# Patient Record
Sex: Female | Born: 1940
Health system: Southern US, Community
[De-identification: ages and names within clinical notes are randomized; demographics above are authoritative.]

## PROBLEM LIST (undated history)

## (undated) DIAGNOSIS — C441192 Basal cell carcinoma of skin of left lower eyelid, including canthus: Secondary | ICD-10-CM

## (undated) DIAGNOSIS — G4733 Obstructive sleep apnea (adult) (pediatric): Secondary | ICD-10-CM

## (undated) DIAGNOSIS — E7800 Pure hypercholesterolemia, unspecified: Secondary | ICD-10-CM

## (undated) DIAGNOSIS — E119 Type 2 diabetes mellitus without complications: Secondary | ICD-10-CM

## (undated) DIAGNOSIS — H409 Unspecified glaucoma: Secondary | ICD-10-CM

## (undated) DIAGNOSIS — E785 Hyperlipidemia, unspecified: Secondary | ICD-10-CM

## (undated) DIAGNOSIS — K219 Gastro-esophageal reflux disease without esophagitis: Secondary | ICD-10-CM

## (undated) DIAGNOSIS — F329 Major depressive disorder, single episode, unspecified: Secondary | ICD-10-CM

## (undated) DIAGNOSIS — M722 Plantar fascial fibromatosis: Secondary | ICD-10-CM

## (undated) DIAGNOSIS — I10 Essential (primary) hypertension: Secondary | ICD-10-CM

## (undated) DIAGNOSIS — Z9989 Dependence on other enabling machines and devices: Secondary | ICD-10-CM

## (undated) DIAGNOSIS — J302 Other seasonal allergic rhinitis: Secondary | ICD-10-CM

## (undated) HISTORY — DX: Major depressive disorder, single episode, unspecified: F32.9

## (undated) HISTORY — PX: WISDOM TOOTH EXTRACTION: SHX21

## (undated) HISTORY — DX: Hyperlipidemia, unspecified: E78.5

## (undated) HISTORY — DX: Type 2 diabetes mellitus without complications: E11.9

## (undated) HISTORY — PX: COLONOSCOPY: SHX174

## (undated) HISTORY — DX: Obstructive sleep apnea (adult) (pediatric): G47.33

## (undated) HISTORY — PX: TONSILLECTOMY: SUR1361

## (undated) HISTORY — DX: Gastro-esophageal reflux disease without esophagitis: K21.9

## (undated) HISTORY — DX: Basal cell carcinoma of skin of left lower eyelid, including canthus: C44.1192

## (undated) HISTORY — DX: Dependence on other enabling machines and devices: Z99.89

## (undated) HISTORY — PX: TUBAL LIGATION: SHX77

---

## 2013-01-27 ENCOUNTER — Emergency Department (HOSPITAL_BASED_OUTPATIENT_CLINIC_OR_DEPARTMENT_OTHER): Payer: No Typology Code available for payment source

## 2013-01-27 ENCOUNTER — Encounter (HOSPITAL_BASED_OUTPATIENT_CLINIC_OR_DEPARTMENT_OTHER): Payer: Self-pay | Admitting: Emergency Medicine

## 2013-01-27 ENCOUNTER — Emergency Department (HOSPITAL_BASED_OUTPATIENT_CLINIC_OR_DEPARTMENT_OTHER)
Admission: EM | Admit: 2013-01-27 | Discharge: 2013-01-27 | Disposition: A | Discharge status: Home / Self Care | Payer: No Typology Code available for payment source | Attending: Emergency Medicine | Admitting: Emergency Medicine

## 2013-01-27 DIAGNOSIS — Z8739 Personal history of other diseases of the musculoskeletal system and connective tissue: Secondary | ICD-10-CM | POA: Insufficient documentation

## 2013-01-27 DIAGNOSIS — Y92410 Unspecified street and highway as the place of occurrence of the external cause: Secondary | ICD-10-CM | POA: Insufficient documentation

## 2013-01-27 DIAGNOSIS — S199XXA Unspecified injury of neck, initial encounter: Secondary | ICD-10-CM | POA: Insufficient documentation

## 2013-01-27 DIAGNOSIS — Z79899 Other long term (current) drug therapy: Secondary | ICD-10-CM | POA: Insufficient documentation

## 2013-01-27 DIAGNOSIS — Z7982 Long term (current) use of aspirin: Secondary | ICD-10-CM | POA: Insufficient documentation

## 2013-01-27 DIAGNOSIS — I10 Essential (primary) hypertension: Secondary | ICD-10-CM | POA: Insufficient documentation

## 2013-01-27 DIAGNOSIS — S0993XA Unspecified injury of face, initial encounter: Secondary | ICD-10-CM | POA: Insufficient documentation

## 2013-01-27 DIAGNOSIS — E7800 Pure hypercholesterolemia, unspecified: Secondary | ICD-10-CM | POA: Insufficient documentation

## 2013-01-27 DIAGNOSIS — S0990XA Unspecified injury of head, initial encounter: Secondary | ICD-10-CM | POA: Insufficient documentation

## 2013-01-27 DIAGNOSIS — Y9389 Activity, other specified: Secondary | ICD-10-CM | POA: Insufficient documentation

## 2013-01-27 DIAGNOSIS — T148XXA Other injury of unspecified body region, initial encounter: Secondary | ICD-10-CM

## 2013-01-27 DIAGNOSIS — Z8669 Personal history of other diseases of the nervous system and sense organs: Secondary | ICD-10-CM | POA: Insufficient documentation

## 2013-01-27 HISTORY — DX: Pure hypercholesterolemia, unspecified: E78.00

## 2013-01-27 HISTORY — DX: Essential (primary) hypertension: I10

## 2013-01-27 HISTORY — DX: Plantar fascial fibromatosis: M72.2

## 2013-01-27 HISTORY — DX: Unspecified glaucoma: H40.9

## 2013-01-27 HISTORY — DX: Other seasonal allergic rhinitis: J30.2

## 2013-01-27 MED ORDER — CYCLOBENZAPRINE HCL 10 MG PO TABS: 10.0000 mg | ORAL_TABLET | Freq: Two times a day (BID) | ORAL | Status: DC | PRN

## 2013-01-27 MED ORDER — NAPROXEN 250 MG PO TABS
250.0000 mg | ORAL_TABLET | Freq: Once | ORAL | Status: AC
  Administered 2013-01-27: 250 mg via ORAL
  Filled 2013-01-27: qty 1

## 2013-01-27 MED ORDER — ACETAMINOPHEN 325 MG PO TABS: 650.0000 mg | ORAL_TABLET | Freq: Four times a day (QID) | ORAL | Status: AC | PRN

## 2013-01-27 NOTE — ED Provider Notes (Signed)
Medical screening examination/treatment/procedure(s) were performed by non-physician practitioner and as supervising physician I was immediately available for consultation/collaboration.  Ethelda Chick, MD 01/27/13 [phone]
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
## 2017-06-08 ENCOUNTER — Encounter: Payer: Self-pay | Admitting: Neurology

## 2017-06-09 ENCOUNTER — Ambulatory Visit (INDEPENDENT_AMBULATORY_CARE_PROVIDER_SITE_OTHER): Payer: Medicare Other | Admitting: Neurology

## 2017-06-09 ENCOUNTER — Encounter: Payer: Self-pay | Admitting: Neurology

## 2017-06-09 VITALS — BP 128/78 | HR 60 | Ht 61.0 in | Wt 125.0 lb

## 2017-06-09 DIAGNOSIS — F5104 Psychophysiologic insomnia: Secondary | ICD-10-CM

## 2017-06-09 DIAGNOSIS — G4733 Obstructive sleep apnea (adult) (pediatric): Secondary | ICD-10-CM

## 2017-06-09 DIAGNOSIS — E1140 Type 2 diabetes mellitus with diabetic neuropathy, unspecified: Secondary | ICD-10-CM

## 2017-06-09 DIAGNOSIS — G4719 Other hypersomnia: Secondary | ICD-10-CM

## 2017-06-09 DIAGNOSIS — E1149 Type 2 diabetes mellitus with other diabetic neurological complication: Secondary | ICD-10-CM | POA: Diagnosis not present

## 2017-06-09 MED ORDER — ALPRAZOLAM 0.5 MG PO TABS: 0.5000 mg | ORAL_TABLET | Freq: Every evening | ORAL | 0 refills | Status: DC | PRN

## 2017-06-09 NOTE — Progress Notes (Signed)
SLEEP MEDICINE CLINIC   Provider:  Larey Seat, [place] D  Primary Care Physician:  Loraine Leriche., MD   Referring Provider: Lilian Coma., MD    Chief Complaint  Patient presents with  . New Patient (Initial Visit)    pt presents here today with her husband. pt had a sleey study in June 2018 and they are sending her here to evaluate the need for CPAP.    HPI:  Brenda Hood is a 76 y.o. female , seen here as in a referral/ revisit  from Dr. Valora Piccolo for a follow up on an outside sleep study(  [facility] , Dr. Tonia Ghent  )   Chief complaint according to patient : Brenda Hood presents today on 06/09/2017 in follow-up from Dr. Derenda Mis, who ordered a sleep study for the patient which was performed at the [facility] neurologic group.  Sleep study was performed on 04/04/2017, the patient had endorsed an Epworth sleepiness score of 20 points, and reported inomnia and fragmented sleep.   She was awake for 204 minutes during the night, there was no REM sleep noticed - sleep efficiency was only 54%, her AHI was 8 / hour which is very low, she would not be considered positive for sleep apnea and would not require CPAP.  Sleep habits are as follows: The patient shares a bedroom with her husband, was a CPAP user. She often dozes off while watching TV or whenever physically not busy during the day. At night however it's difficult for her to go to sleep and she has started to take melatonin 1 mg tablets to 10 are allowed per day she was told. She does still take an hour to go to sleep on average, once asleep she has trouble staying asleep, she is not always sure what wakes her, but once awake she will go to the bathroom. She will have multiple of these wakefulness.  She denies dreams, hallucinations or sleep paralysis, no cataplexy has been reported. Throughout the night, and rises in the morning usually at 8 AM.  Varies greatly- she sleeps on her side , on  1 pillow. She raises the head of her bed ( 20 degrees) The bedroom is cool and quiet and dark. She sometimes turns the Tv on- and will go to sleep.   Sleep medical history and family sleep history:  No insomnia in family. Diabetes in all siblings, both parents.   Social history:  Married, never used tobacco , no ETOH consue, Caffeine ; coffee 2 -3 in AM. Soda and iced tea - now 1 a day.    Review of Systems: Out of a complete 14 system review, the patient complains of only the following symptoms, and all other reviewed systems are negative.  The patient complains of excessive daytime sleepiness reflected and an Epworth score of 20 out of 24 points, tendency to bruise easier, she has a history of anemia, coughing and wheezing, diarrhea, restless legs, insomnia she has been diagnosed with diabetes possible neuropathy, high cholesterol, and had the skin surgery in 2017 near the left lower eye lid for for basal cell carcinoma, Mohs procedure .   Epworth score 20/24  , Fatigue severity score 54   , depression score 4/15    Social History   Social History  . Marital status: Married    Spouse name: N/A  . Number of children: N/A  . Years of education: N/A   Occupational History  . Not on file.  Social History Main Topics  . Smoking status: Never Smoker  . Smokeless tobacco: Never Used  . Alcohol use No  . Drug use: No  . Sexual activity: Not on file   Other Topics Concern  . Not on file   Social History Narrative  . No narrative on file    Family History  Problem Relation Age of Onset  . Cancer Mother   . Diabetes Mother   . Diabetes Father     Past Medical History:  Diagnosis Date  . Basal cell carcinoma of left lower eyelid   . Diabetes mellitus without complication ([place])   . GERD (gastroesophageal reflux disease)   . Glaucoma   . Hypercholesteremia   . Hyperlipidemia   . Hypertension   . Major depression   . Plantar fasciitis   . Seasonal allergies     Past  Surgical History:  Procedure Laterality Date  . COLONOSCOPY    . TONSILLECTOMY    . TUBAL LIGATION    . WISDOM TOOTH EXTRACTION      Current Outpatient Prescriptions  Medication Sig Dispense Refill  . acetaminophen (TYLENOL) 325 MG tablet Take 2 tablets (650 mg total) by mouth every 6 (six) hours as needed for pain. 20 tablet 0  . alendronate (FOSAMAX) 70 MG tablet Take 70 mg by mouth once a week. Take with a full glass of water on an empty stomach.    Marland Kitchen amLODipine (NORVASC) 5 MG tablet Take 5 mg by mouth daily.    Marland Kitchen aspirin 81 MG tablet Take 81 mg by mouth daily.    Marland Kitchen atorvastatin (LIPITOR) 80 MG tablet Take 40 mg by mouth daily.    . Calcium Carbonate-Vitamin D (CALCIUM 600/VITAMIN D PO) Take 2 tablets by mouth daily.    . cetirizine (ZYRTEC) 10 MG tablet Take 10 mg by mouth daily.    . cyclobenzaprine (FLEXERIL) 10 MG tablet Take 1 tablet (10 mg total) by mouth 2 (two) times daily as needed for muscle spasms. 20 tablet 0  . fluticasone (FLONASE) 50 MCG/ACT nasal spray Place 2 sprays into both nostrils daily.    . mometasone (NASONEX) 50 MCG/ACT nasal spray Place 2 sprays into the nose daily.    . montelukast (SINGULAIR) 10 MG tablet Take 10 mg by mouth at bedtime.    . pantoprazole (PROTONIX) 40 MG tablet Take 40 mg by mouth daily.    . Travoprost, BAK Free, (TRAVATAN) 0.004 % SOLN ophthalmic solution 1 drop at bedtime.    Marland Kitchen UNKNOWN TO PATIENT HTN med     No current facility-administered medications for this visit.     Allergies as of 06/09/2017 - Review Complete 01/27/2013  Allergen Reaction Noted  . Codeine Itching 01/27/2013  . Metformin and related Diarrhea 01/27/2013    Vitals: BP 128/78   Pulse 60   Ht 5\' 1"  (1.549 m)   Wt 125 lb (56.7 kg)   BMI 23.62 kg/m  Last Weight:  Wt Readings from Last 1 Encounters:  06/09/17 125 lb (56.7 kg)   RXV:QMGQ mass index is 23.62 kg/m.     Last Height:   Ht Readings from Last 1 Encounters:  06/09/17 5\' 1"  (1.549 m)     Physical exam:  General: The patient is awake, alert and appears not in acute distress. The patient is well groomed. Head: Normocephalic, atraumatic. Neck is supple. Mallampati 2,  neck circumference:14.25. Nasal airflow congestion- rhinitis , TMJ is not evident . Retrognathia is seen.  Cardiovascular:  Regular  rate and rhythm , without  murmurs or carotid bruit, and without distended neck veins. Respiratory: Lungs are clear to auscultation. Skin:  Without evidence of edema, or rash Trunk: BMI is 24 Neurologic exam : The patient is awake and alert, oriented to place and time.  Logorrhoeic.   Attention span limited  Speech is fluent with dysphonia .  Cranial nerves: Pupils are equal and briskly reactive to light.  Extraocular movements  in vertical and horizontal planes intact and without nystagmus. Visual fields by finger perimetry are intact. Hearing to finger rub intact.  Facial sensation intact to fine touch. Facial motor strength is symmetric and tongue and uvula move midline. Shoulder shrug was symmetrical.   Motor exam: Normal tone, muscle bulk and symmetric strength in all extremities. Sensory:  Fine touch, pinprick and vibration were tested, reduced fine touch and vibration in both feet - often pin and needle Coordination:  Finger-to-nose maneuver  normal without evidence of ataxia, dysmetria or tremor. Gait and station: Patient walks without assistive device .    Assessment:  After physical and neurologic examination, review of laboratory studies,  Personal review of imaging studies, reports of other /same  Imaging studies, results of polysomnography and / or neurophysiology testing and pre-existing records as far as provided in visit., my assessment is   1)  Fragmented sleep _ Poor sleep habits and sleep hygiene. I explained some in detail, the TV should be not in the bedroom, and she needs to avoid daytime naps and caffeine.   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  I asjked her to take CYCLOBENZAPRINE in PM and before bedtime, not in daytime,  I would not be opposed to give her a daytime stimulant should her sleep study not reveal apnea  3) I consider her sleep study not diagnostic, the patient reached an AHI of only 8 but her sleep was so fragmented, that I would doubt its validity. Overall sleep time was less than 4 hours at only 211 minutes. I will petition her insurance to allow me to repeat the sleep study, I will not be opposed to use a mild sleep aid, she will have to have a designated driver to pick her up after the sleep study. If the repeat sleep study confirms apnea we will treat accordingly, if she is again left without a physiologic diagnosis, I would consider evaluating her for narcolepsy. The gold standard test for the sleep disorder however would not be valid on the medications she is currently on.    The patient was advised of the nature of the diagnosed disorder , the treatment options and the  risks for general health and wellness arising from not treating the condition.   I spent more than  45 minutes of face to face time with the patient.  Greater than 50% of time was spent in counseling and coordination of care. We have discussed the diagnosis and differential and I answered the patient's questions.    Plan:  Treatment plan and additional workup :  SPLIT night sleep study on Xanax.    Larey Seat, MD 7/61/6073, 71:06 AM  Certified in Neurology by ABPN Certified in Sleep Medicine by [facility] [address], [address]  [place], [place] [address]

## 2017-06-09 NOTE — Patient Instructions (Signed)
Please remember to try to maintain good sleep hygiene, which means: Keep a regular sleep and wake schedule, try not to exercise or have a meal within 2 hours of your bedtime, try to keep your bedroom conducive for sleep, that is, cool and dark, without light distractors such as an illuminated alarm clock, and refrain from watching TV right before sleep or in the middle of the night and do not keep the TV or radio on during the night. Also, try not to use or play on electronic devices at bedtime, such as your cell phone, tablet PC or laptop. If you like to read at bedtime on an electronic device, try to dim the background light as much as possible. Do not eat in the middle of the night.   We will request a sleep study.    We will look for leg twitching and snoring or sleep apnea.   For chronic insomnia, you are best followed by a psychiatrist and/or sleep psychologist.   We will call you with the sleep study results and make a follow up appointment if needed.   Hypersomnia Hypersomnia is when you feel extremely tired during the day even though you're getting plenty of sleep at night. You may need to take naps during the day, and you may also be extremely difficult to wake up when you are sleeping. What are the causes? The cause of your hypersomnia may not be known. Hypersomnia may be caused by:  Medicines.  Sleep disorders, such as narcolepsy.  Trauma or injury to your head or nervous system.  Using drugs or alcohol.  Tumors.  Medical conditions, such as depression or hypothyroidism.  Genetics.  What are the signs or symptoms? The main symptoms of hypersomnia include:  Feeling extremely tired throughout the day.  Being very difficult to wake up.  Sleeping for longer and longer periods.  Taking naps throughout the day.  Other symptoms may include:  Feeling: ? Restless. ? Annoyed. ? Anxious. ? Low energy.  Having  difficulty: ? Remembering. ? Speaking. ? Thinking.  Losing your appetite.  Experiencing hallucinations.  How is this diagnosed? Hypersomnia may be diagnosed by:  Medical history and physical exam. This will include a sleep history.  Completing sleep logs.  Tests may also be done, such as: ? Polysomnography. ? Multiple sleep latency test (MSLT).  How is this treated? There is no cure for hypersomnia, but treatment can be very effective in helping manage the condition. Treatment may include:  Lifestyle and sleeping strategies to help cope with the condition.  Stimulant medicines.  Treating any underlying causes of hypersomnia.  Follow these instructions at home:  Take medicines only as directed by your health care provider.  Schedule short naps for when you feel sleepiest during the day. Tell your employer or teachers that you have hypersomnia. You may be able to adjust your schedule to include time for naps.  Avoid drinking alcohol or caffeinated beverages.  Do not eat a heavy meal before bedtime. Eat at about the same times every day.  Do not drive or operate heavy machinery if you are sleepy.  Do not swim or go out on the water without a life jacket.  If possible, adjust your schedule so that you do not have to work or be active at night.  Keep all follow-up visits as directed by your health care provider. This is important. Contact a health care provider if:  You have new symptoms.  Your symptoms get worse. Get help   right away if: You have serious thoughts of hurting yourself or someone else. This information is not intended to replace advice given to you by your health care provider. Make sure you discuss any questions you have with your health care provider. Document Released: 09/17/2002 Document Revised: 03/04/2016 Document Reviewed: 05/02/2014 Elsevier Interactive Patient Education  2018 [employer]  

## 2017-06-30 ENCOUNTER — Ambulatory Visit (INDEPENDENT_AMBULATORY_CARE_PROVIDER_SITE_OTHER): Payer: Medicare Other | Admitting: Neurology

## 2017-06-30 DIAGNOSIS — G4761 Periodic limb movement disorder: Secondary | ICD-10-CM

## 2017-06-30 DIAGNOSIS — F5104 Psychophysiologic insomnia: Secondary | ICD-10-CM

## 2017-06-30 DIAGNOSIS — G4719 Other hypersomnia: Secondary | ICD-10-CM

## 2017-06-30 DIAGNOSIS — E1140 Type 2 diabetes mellitus with diabetic neuropathy, unspecified: Secondary | ICD-10-CM

## 2017-06-30 DIAGNOSIS — E1149 Type 2 diabetes mellitus with other diabetic neurological complication: Secondary | ICD-10-CM

## 2017-06-30 DIAGNOSIS — Z72821 Inadequate sleep hygiene: Secondary | ICD-10-CM

## 2017-06-30 DIAGNOSIS — G4733 Obstructive sleep apnea (adult) (pediatric): Secondary | ICD-10-CM

## 2017-07-04 NOTE — Addendum Note (Signed)
Addended by: Larey Seat on: 07/04/2017 09:58 AM   Modules accepted: Orders

## 2017-07-04 NOTE — Procedures (Signed)
PATIENT'S NAME:  Brenda, Hood DOB:      November 10, 1940      MR#:    [id number]     DATE OF RECORDING: 06/30/2017 REFERRING M.D.:  Tiana Loft, MD Study Performed:   Baseline Polysomnogram HISTORY:  The patient complains of excessive daytime sleepiness reflected and an Epworth score of 20 / 24 points.  Had been tested in a PSG at Dr. Marya Amsler [facility]'s lab and is here for follow up on outside results. She slept very little in that recording from 04-04-2017, AHI was 8, no REM sleep. She has poor sleep habits and fragmented sleep at home. Insomnia. The patient endorsed the Epworth Sleepiness Scale at 20 points and FSS at 54 points, geriatric depression 4/15 points. Denies narcoleptic dream intrusion, sleep paralysis or cataplectic symptoms.  The patient's weight 125 pounds with a height of 61 (inches), resulting in a BMI of 23.7 kg/m2. The patient's neck circumference measured 14 inches.  CURRENT MEDICATIONS: Tylenol, Fosamax, Norvasc, Aspirin, Lipitor, Calcium, Zyrtec, Flexeril, Flonase, Nasonex, Singulair, Protonix, Travatan eye drops   PROCEDURE:  This is a multichannel digital polysomnogram utilizing the SomnoStar 11.2 system.  Electrodes and sensors were applied and monitored per AASM Specifications.   EEG, EOG, Chin and Limb EMG, were sampled at 200 Hz.  ECG, Snore and Nasal Pressure, Thermal Airflow, Respiratory Effort, CPAP Flow and Pressure, Oximetry was sampled at 50 Hz. Digital video and audio were recorded.      BASELINE STUDY Lights Out was at 21:03 and Lights On at 05:02.  Total recording time (TRT) was 479.5 minutes, with a total sleep time (TST) of 282 minutes. The patient's sleep latency was 213 minutes.  REM latency was 302 minutes.  The sleep efficiency was again low at 58.8 %.     SLEEP ARCHITECTURE: WASO (Wake after sleep onset) was 162 minutes.  There were 26.5 minutes in Stage N1, 217 minutes Stage N2, 25 minutes Stage N3 and 13.5 minutes in Stage REM.  The percentage of Stage N1 was  9.4%, Stage N2 was 77.%, Stage N3 was 8.9% and Stage R (REM sleep) was 4.8%.   RESPIRATORY ANALYSIS:  There were a total of 27 respiratory events:  2 obstructive apneas, 25 hypopneas with 0 respiratory event related arousals (RERAs).     The total APNEA/HYPOPNEA INDEX (AHI) was 5.7/hour and the total RESPIRATORY DISTURBANCE INDEX was 5.7 /hour.  2 events occurred in REM sleep and 46 events in NREM. The REM AHI was 8.9 /hour, versus a non-REM AHI of 5.6. The patient spent 108 minutes of total sleep time in the supine position and 174 minutes in non-supine. The supine AHI was 8.3 versus a non-supine AHI of 4.1.  OXYGEN SATURATION & C02:  The Wake baseline 02 saturation was 96%, with the lowest being 79%. Time spent below 89% saturation equaled 52 minutes.   PERIODIC LIMB MOVEMENTS:  The patient had a total of 134 Periodic Limb Movements.  The Periodic Limb Movement (PLM) index was 28.5 and the PLM Arousal index was 10.0 /hour. The arousals were noted as: 41 were spontaneous, 47 were associated with PLMs, and 0 were associated with respiratory events.  Audio and video analysis did not show any abnormal or unusual movements, behaviors, phonations or vocalizations.  Snoring was noted. The patient took one bathroom break. EKG was in keeping with normal sinus rhythm and PVCs (NSR).  Post-study, the patient indicated that sleep was the same as usual. She took a sleep aid at midnight.  IMPRESSION:  1. Mild Obstructive Sleep Apnea (OSA) but significant hypoxemia. 2. Severe Periodic Limb Movement Disorder (PLMD) 3. Primary Snoring 4. Many spontaneous arousals and delayed sleep onset due to (reported) pain and discomfort, cause of insomnia.   RECOMMENDATIONS:  1. Advise full-night, attended, CPAP titration study to optimize therapy.   2. Avoid caffeine-containing beverages and chocolate. 3. Consider cardiopulmonary evaluation for the hypoxia if not previously performed.   4. Correlate clinically for a  history consistent with regarding restless legs syndrome (RLS).    Consider secondary restless legs syndrome.  Pharmacotherapy may be warranted.  Obtain a serum ferritin level if the clinical history is consistent with RLS.  Consider iron therapy and evaluation for iron deficiency anemia if the serum ferritin level < 50 ng/mL.  Certain medications or substances may aggravate RLS and common offenders may include the following:  nicotine, caffeine, SSRIs, TCAs, phenothiazine, dopamine antagonists, diphenhydramine, and alcohol.   5. Consider dedicated sleep psychology referral if insomnia is of clinical concern ( cognitive behavior therapy)  6. A follow up appointment will be scheduled in the Sleep Clinic at [facility]. The referring provider will be notified of the results.      I certify that I have reviewed the entire raw data recording prior to the issuance of this report in accordance with the Standards of Accreditation of the [place] [employer] (AASM)      Larey Seat, MD      07-03-2017  Diplomat, American Board of Psychiatry and Neurology  Diplomat, American Board of [place] Director, [place] [facility] at [employer]

## 2017-07-05 ENCOUNTER — Telehealth: Payer: Self-pay | Admitting: Neurology

## 2017-07-05 NOTE — Telephone Encounter (Signed)
-----   Message from Larey Seat, MD sent at 07/04/2017  9:58 AM EDT ----- Strongly recommend cognitive behavior therapy to improve insomnia, Dr Valora Piccolo may explore pain management . Her AHI was again lower than 10, but associated with hypoxemia and this makes CPAP a preferred treatment option.  I would recommend use of CPAP to improve hypoxia. CD  Cc Dr Valora Piccolo.

## 2017-07-05 NOTE — Telephone Encounter (Signed)
Called patient to discuss sleep study results. No answer. LVM for patient to return call

## 2017-07-06 NOTE — Telephone Encounter (Signed)
Called and spoke with the patient and informed her of the sleep study. I explained the results and made her aware that Dr Brett Fairy recommends bringing her back in for the Cpap titration study. I also explained that we may need to work her up for RLS. Pt verbalized understanding and will wait for the sleep lab to contact her.

## 2017-07-06 NOTE — Telephone Encounter (Signed)
Patient called office returning RN's call.  Please call °

## 2017-08-04 ENCOUNTER — Ambulatory Visit (INDEPENDENT_AMBULATORY_CARE_PROVIDER_SITE_OTHER): Payer: Medicare Other | Admitting: Neurology

## 2017-08-04 DIAGNOSIS — E1140 Type 2 diabetes mellitus with diabetic neuropathy, unspecified: Secondary | ICD-10-CM

## 2017-08-04 DIAGNOSIS — G4733 Obstructive sleep apnea (adult) (pediatric): Secondary | ICD-10-CM

## 2017-08-04 DIAGNOSIS — E1149 Type 2 diabetes mellitus with other diabetic neurological complication: Secondary | ICD-10-CM

## 2017-08-04 DIAGNOSIS — G4761 Periodic limb movement disorder: Secondary | ICD-10-CM

## 2017-08-04 DIAGNOSIS — Z72821 Inadequate sleep hygiene: Secondary | ICD-10-CM

## 2017-08-04 DIAGNOSIS — F5104 Psychophysiologic insomnia: Secondary | ICD-10-CM

## 2017-08-04 DIAGNOSIS — G4719 Other hypersomnia: Secondary | ICD-10-CM

## 2017-08-08 DIAGNOSIS — G4719 Other hypersomnia: Secondary | ICD-10-CM | POA: Insufficient documentation

## 2017-08-08 DIAGNOSIS — F5104 Psychophysiologic insomnia: Secondary | ICD-10-CM | POA: Insufficient documentation

## 2017-08-08 DIAGNOSIS — G4733 Obstructive sleep apnea (adult) (pediatric): Secondary | ICD-10-CM | POA: Insufficient documentation

## 2017-08-08 DIAGNOSIS — E1140 Type 2 diabetes mellitus with diabetic neuropathy, unspecified: Secondary | ICD-10-CM | POA: Insufficient documentation

## 2017-08-08 DIAGNOSIS — Z72821 Inadequate sleep hygiene: Secondary | ICD-10-CM | POA: Insufficient documentation

## 2017-08-08 DIAGNOSIS — G4761 Periodic limb movement disorder: Secondary | ICD-10-CM | POA: Insufficient documentation

## 2017-08-08 DIAGNOSIS — E1149 Type 2 diabetes mellitus with other diabetic neurological complication: Secondary | ICD-10-CM

## 2017-08-08 NOTE — Addendum Note (Signed)
Addended by: Larey Seat on: 08/08/2017 05:22 PM   Modules accepted: Orders

## 2017-08-08 NOTE — Procedures (Signed)
PATIENT'S NAME:  Brenda Hood, Brenda Hood DOB:      1941/09/01      MR#:    [id number]     DATE OF RECORDING: 08/04/2017 REFERRING M.D.:  Tiana Loft , MD Study Performed:   CPAP  Titration HISTORY:  This female patient returned after a PSG from 06-30-2017 revealed mild OSA with prolonged hypoxemia. The AHI in general was 5.7/hr. time of total desaturation below 89% was 4minutes. Nocturia times 3 on average reported, EKG with PVCs. Severe PLMs, now treated on medication.   The patient endorsed the Epworth Sleepiness Scale at 20 points.   The patient's weight 125 pounds with a height of 61 (inches), resulting in a BMI of 23.7 kg/m2.  CURRENT MEDICATIONS: Tylenol, Fosamax, Norvasc, Aspirin, Lipitor, Calcium, Zyrtec, Flexeril, Flonase, Nasonex, Singulair, Protonix, Travatan eye drops.   PROCEDURE:  This is a multichannel digital polysomnogram utilizing the SomnoStar 11.2 system.  Electrodes and sensors were applied and monitored per AASM Specifications.   EEG, EOG, Chin and Limb EMG, were sampled at 200 Hz.  ECG, Snore and Nasal Pressure, Thermal Airflow, Respiratory Effort, CPAP Flow and Pressure, Oximetry was sampled at 50 Hz. Digital video and audio were recorded.      CPAP was initiated at 5 cmH20 with heated humidity per AASM split night standards and pressure was advanced to 10 cmH20 because of hypopneas, apneas and desaturations.  At a PAP pressure of 8 cmH20, there was a reduction of the AHI to 1.6 and on 10 cm water an Ahi of 5.4/hr. was recorded.   Lights Out was at 20:58 and Lights On at 05:00. Total recording time (TRT) was 482.5 minutes, with a total sleep time (TST) of 437.5 minutes. The patient's sleep latency was 12.5 minutes with 0 minutes of wake time after sleep onset. REM latency was 403 minutes.  The sleep efficiency was 90.7 %.    SLEEP ARCHITECTURE: WASO (Wake after sleep onset) was 35.5 minutes.  There were 20.5 minutes in Stage N1, 368.5 minutes Stage N2, 9 minutes Stage N3 and 39.5  minutes in Stage REM.  The percentage of Stage N1 was 4.7%, Stage N2 was 84.2%, Stage N3 was 2.1% and Stage R (REM sleep) was 9.%.   RESPIRATORY ANALYSIS:  There was a total of 28 respiratory events: 9 obstructive apneas, 1 central apnea and 6 mixed apneas with 12 hypopneas. The patient also had 0 respiratory event related arousals (RERAs).     The total APNEA/HYPOPNEA INDEX  (AHI) was 3.8 /hour and the total RESPIRATORY DISTURBANCE INDEX was 3.8 .hour  10 events occurred in REM sleep and 18 events in NREM. The REM AHI was 15.2 /hour versus a non-REM AHI of 2.7 /hour.  The patient spent 218 minutes of total sleep time in the supine position and 220 minutes in non-supine. The supine AHI was 5.5, versus a non-supine AHI of 2.2.  OXYGEN SATURATION & C02:  The baseline 02 saturation was 96%, with the lowest being 82%. Time spent below 89% saturation equaled 13 minutes.  ERIODIC LIMB MOVEMENTS:   The patient had a total of 148 Periodic Limb Movements. The Periodic Limb Movement (PLM) index was 20.3 and the PLM Arousal index was 7.5 /hour.  Post-study, the patient indicated that sleep was the same as usual.  The patient was fitted with an Airfit  P-10 nasal pillows in small size.  DIAGNOSIS Extreme fragmentation of sleep.  1. Primary Snoring 2. Mild Obstructive Sleep Apnea marginally improved under CPAP. 3. Hypoxemia improved under  CPAP.  4. PLMs persisted initially under CPAP, but became less frequent under pressures of 8 or more cm water.   PLANS/RECOMMENDATIONS: 1. Consider one month trial of Auto PAP- 5-10 cm water with above named interface. Will follow up on Epworth score after that time, either with me, or with NP.  a. Avoidance of medications with muscle relaxant properties. b. Avoiding sleeping in the supine position (on one's back). c. Improvement of nasal patency if indicated. 2. PLMS:  Exclude secondary factors for periodic limb movements of sleep. RLS may be present.   Check ferritin,  renal function, electrolytes, magnesium, calcium, B12, folate (treat deficiencies if present; serum ferritin values less than 50 micro-g/l should be treated with iron replacement, along with appropriate investigation for causes of iron deficiency).   3. Exclude any history or physical examination data supporting peripheral neuropathy.  4. Consider using a stimulant for day time alertness.   A follow up appointment will be scheduled in the Sleep Clinic at [facility]. Please call [phone] with any questions.     I certify that I have reviewed the entire raw data recording prior to the issuance of this report in accordance with the Standards of Accreditation of the American Academy of Sleep Medicine (AASM)    Larey Seat, M.D.    08-08-2017  Diplomat, American Board of Psychiatry and Neurology  Diplomat, [place] of Sleep Medicine Medical Director, [place] [facility] at [facility]

## 2017-08-10 ENCOUNTER — Telehealth: Payer: Self-pay | Admitting: Neurology

## 2017-08-10 ENCOUNTER — Encounter: Payer: Self-pay | Admitting: Neurology

## 2017-08-10 NOTE — Telephone Encounter (Signed)
Called and spoke with the patient and made her aware that she tolerated the cpap pressure well during her CPAP titration. Dr dohmeier is recommending we try a 30 day trial of CPAP to see how well she tolerates the CPAP treatment. I will send an order to [facility] to get the patient set up with the 30 day trial. I have set up a apt for Oct 27 2016 at 8:45 am with Cecille Rubin, NP. Pt has requested to be placed on waiting list in event that something comes up sooner. Pt verbalized understanding. Pt had no questions at this time but was encouraged to call back if questions arise.

## 2017-08-10 NOTE — Telephone Encounter (Signed)
-----   Message from Larey Seat, MD sent at 08/08/2017  5:21 PM EDT ----- This study confirmed extreme sleep fragmentation, poor sleep efficiency and CPAP did not change the sleep architecture significantly. PLM disorder with DM neuropathy, poor sleep habits and and hypoxemia with OSA all culminated to this result.  Patient to follow up after 30 days on auto titration trial. Epworth to be retaken.   Consuello Bossier, MD

## 2017-10-26 ENCOUNTER — Encounter: Payer: Self-pay | Admitting: Nurse Practitioner

## 2017-10-26 NOTE — Progress Notes (Signed)
[facility]  PATIENT: Brenda Hood DOB: June 17, 1941   REASON FOR VISIT: Follow-up  sleep apnea with initial CPAP compliance HISTORY FROM: Patient    HISTORY OF PRESENT ILLNESS:UPDATE 1/17/2019CM.  Brenda Hood, 77 year old female returns with CPAP compliance initial compliance check.  She is having a major leak she claims with her mask.  Data 09/27/2017 to 10/26/2017 shows greater than 4 hours usage for 18 days at 60%.  Average usage 5 hours 33 minutes set pressure 5-10 cm.  AHI 2 . Significant leak  Present.  She continues to have some fatigue on awakening.  Does not feel well rested.  She returns for reevaluation   8/30/18CDBrenda Vandall is a 77 y.o. female , seen here as in a referral/ revisit  from Dr. Valora Piccolo for a follow up on an outside sleep study(  [facility] , Dr. Tonia Ghent  )   Chief complaint according to patient : Brenda Hood presents today on 06/09/2017 in follow-up from Dr. Derenda Mis, who ordered a sleep study for the patient which was performed at the [facility].  Sleep study was performed on 04/04/2017, the patient had endorsed an Epworth sleepiness score of 20 points, and reported inomnia and fragmented sleep.   She was awake for 204 minutes during the night, there was no REM sleep noticed - sleep efficiency was only 54%, her AHI was 8 / hour which is very low, she would not be considered positive for sleep apnea and would not require CPAP.  Sleep habits are as follows: The patient shares a bedroom with her husband, was a CPAP user. She often dozes off while watching TV or whenever physically not busy during the day. At night however it's difficult for her to go to sleep and she has started to take melatonin 1 mg tablets to 10 are allowed per day she was told. She does still take an hour to go to sleep on average, once asleep she has trouble staying asleep, she is not always sure what wakes  her, but once awake she will go to the bathroom. She will have multiple of these wakefulness.  She denies dreams, hallucinations or sleep paralysis, no cataplexy has been reported. Throughout the night, and rises in the morning usually at 8 AM.  Varies greatly- she sleeps on her side , on 1 pillow. She raises the head of her bed ( 20 degrees) The bedroom is cool and quiet and dark. She sometimes turns the Tv on- and will go to sleep.   REVIEW OF SYSTEMS: Full 14 system review of systems performed and notable only for those listed, all others are neg:  Constitutional: Fatigue Cardiovascular: neg Ear/Nose/Throat: neg  Skin: neg Eyes: Blurred vision Respiratory: neg Gastroitestinal: neg  Hematology/Lymphatic: Easy bruising Endocrine: neg Musculoskeletal:neg Allergy/Immunology: neg Neurological: neg Psychiatric: neg Sleep : Daytime sleepiness obstructive sleep apnea with CPAP   ALLERGIES: Allergies  Allergen Reactions  . Codeine Itching  . Metformin And Related Diarrhea    HOME MEDICATIONS: Outpatient Medications Prior to Visit  Medication Sig Dispense Refill  . acetaminophen (TYLENOL) 325 MG tablet Take 2 tablets (650 mg total) by mouth every 6 (six) hours as needed for pain. 20 tablet 0  . alendronate (FOSAMAX) 70 MG tablet Take 70 mg by mouth once a week. Take with a full glass of water on an empty stomach.    . ALPRAZolam (XANAX) 0.5 MG tablet Take 1 tablet (0.5 mg total) by mouth at bedtime as needed  for anxiety. 2 tablet 0  . amLODipine (NORVASC) 5 MG tablet Take 5 mg by mouth daily.    Marland Kitchen aspirin 81 MG tablet Take 81 mg by mouth daily.    Marland Kitchen atorvastatin (LIPITOR) 80 MG tablet Take 40 mg by mouth daily.    . brimonidine-timolol (COMBIGAN) 0.2-0.5 % ophthalmic solution Place 1 drop into both eyes every 12 (twelve) hours.    . Calcium Carbonate-Vitamin D (CALCIUM 600/VITAMIN D PO) Take 2 tablets by mouth daily.    . cetirizine (ZYRTEC) 10 MG tablet Take 10 mg by mouth daily.      . cyclobenzaprine (FLEXERIL) 10 MG tablet Take 1 tablet (10 mg total) by mouth 2 (two) times daily as needed for muscle spasms. 20 tablet 0  . fluticasone (FLONASE) 50 MCG/ACT nasal spray Place 2 sprays into both nostrils daily.    . montelukast (SINGULAIR) 10 MG tablet Take 10 mg by mouth at bedtime.    . pantoprazole (PROTONIX) 40 MG tablet Take 40 mg by mouth daily.    . Travoprost, BAK Free, (TRAVATAN) 0.004 % SOLN ophthalmic solution 1 drop at bedtime.    Marland Kitchen UNKNOWN TO PATIENT HTN med    . mometasone (NASONEX) 50 MCG/ACT nasal spray Place 2 sprays into the nose daily.     No facility-administered medications prior to visit.     PAST MEDICAL HISTORY: Past Medical History:  Diagnosis Date  . Basal cell carcinoma of left lower eyelid   . Diabetes mellitus without complication ([place])   . GERD (gastroesophageal reflux disease)   . Glaucoma   . Hypercholesteremia   . Hyperlipidemia   . Hypertension   . Major depression   . Plantar fasciitis   . Seasonal allergies     PAST SURGICAL HISTORY: Past Surgical History:  Procedure Laterality Date  . COLONOSCOPY    . TONSILLECTOMY    . TUBAL LIGATION    . WISDOM TOOTH EXTRACTION      FAMILY HISTORY: Family History  Problem Relation Age of Onset  . Cancer Mother   . Diabetes Mother   . Diabetes Father     SOCIAL HISTORY: Social History   Socioeconomic History  . Marital status: Married    Spouse name: Not on file  . Number of children: Not on file  . Years of education: Not on file  . Highest education level: Not on file  Social Needs  . Financial resource strain: Not on file  . Food insecurity - worry: Not on file  . Food insecurity - inability: Not on file  . Transportation needs - medical: Not on file  . Transportation needs - non-medical: Not on file  Occupational History  . Not on file  Tobacco Use  . Smoking status: Never Smoker  . Smokeless tobacco: Never Used  Substance and Sexual Activity  . Alcohol use:  No  . Drug use: No  . Sexual activity: Not on file  Other Topics Concern  . Not on file  Social History Narrative  . Not on file     PHYSICAL EXAM  Vitals:   10/27/17 0845  BP: (!) 138/59  Pulse: 61  Weight: 130 lb (59 kg)   Body mass index is 24.56 kg/m.  Generalized: Well developed, in no acute distress  Head: normocephalic and atraumatic,. Oropharynx benign  Neck: Supple,  Musculoskeletal: No deformity   Neurological examination   Mentation: Alert oriented to time, place, history taking. Attention span and concentration appropriate. Recent and remote memory intact.  Follows all commands speech and language fluent.   Cranial nerve II-XII: Pupils were equal round reactive to light extraocular movements were full, visual field were full on confrontational test. Facial sensation and strength were normal. hearing was intact to finger rubbing bilaterally. Uvula tongue midline. head turning and shoulder shrug were normal and symmetric.Tongue protrusion into cheek strength was normal. Motor: normal bulk and tone, full strength in the BUE, BLE,  Sensory: normal and symmetric to light touch, Coordination: finger-nose-finger, heel-to-shin bilaterally, no dysmetria Gait and Station: Rising up from seated position without assistance, normal stance,  moderate stride, good arm swing, smooth turning, able to perform tiptoe, and heel walking without difficulty. Tandem gait is unsteady  DIAGNOSTIC DATA (LABS, IMAGING, TESTING) - I reviewed patient records, labs, notes, testing and imaging myself where available.  ASSESSMENT AND PLAN  77 y.o. year old female  has a past medical history of obstructive sleep apnea here for initial CPAP compliance.Data 09/27/2017 to 10/26/2017 shows greater than 4 hours usage for 18 days at 60%.  Average usage 5 hours 33 minutes set pressure 5-10 cm.  AHI 2 . Significant leak  Present.    PLAN:Overall complaint 60% reviewed data with patient  significant  leak need to get a mask refitting This can either be at the equipment company or make an appointment at our sleep lab with Robin Follow-up in 3 months for repeat compliance Dennie Bible, [facility], [facility], [facility] [address], [place], [place] [address] [phone]

## 2017-10-27 ENCOUNTER — Encounter: Payer: Self-pay | Admitting: Nurse Practitioner

## 2017-10-27 ENCOUNTER — Encounter (INDEPENDENT_AMBULATORY_CARE_PROVIDER_SITE_OTHER): Payer: Self-pay

## 2017-10-27 ENCOUNTER — Ambulatory Visit: Payer: Medicare HMO | Admitting: Nurse Practitioner

## 2017-10-27 DIAGNOSIS — G4733 Obstructive sleep apnea (adult) (pediatric): Secondary | ICD-10-CM | POA: Diagnosis not present

## 2017-10-27 DIAGNOSIS — Z9989 Dependence on other enabling machines and devices: Secondary | ICD-10-CM | POA: Diagnosis not present

## 2017-10-27 NOTE — Progress Notes (Signed)
I agree with the assessment and plan as directed by NP .The patient is known to me and followed for OSA/ CPAP  .   Brandalyn Harting, MD

## 2017-10-27 NOTE — Patient Instructions (Signed)
Overall complaint 60% Significant leak need to get a mask refitting This can either be at the equipment company or make an appointment at our sleep lab Follow-up in 3 months for repeat compliance

## 2017-11-09 ENCOUNTER — Ambulatory Visit: Payer: Self-pay | Admitting: Nurse Practitioner

## 2018-01-24 ENCOUNTER — Encounter: Payer: Self-pay | Admitting: Nurse Practitioner

## 2018-01-25 NOTE — Progress Notes (Signed)
[facility]  PATIENT: Brenda Hood DOB: [birth year]   REASON FOR VISIT: Follow-up  sleep apnea with initial CPAP compliance HISTORY FROM: Patient    HISTORY OF PRESENT ILLNESS:UPDATE 4/18/2019CM Brenda Hood, 77 year old female returns for follow-up with obstructive sleep apnea here for CPAP compliance.  When last seen she had a mask that leaked.  She has gone through several masks since that time and feels like the one she got 3 weeks ago is working the best .  She sleeps on her side.  She occasionally wakes up and feels air leaking.  She was made aware she needs to tighten the mask at that point.  CPAP data dated 12/26/2017-01/24/2018 shows compliance greater than 4 hours for 16 days and 53%.  Average usage 5 hours 49 minutes.  Set pressure 5-10 cm.  EPR level 3 AHI 1.9.  She is recently been diagnosed with diabetes and is now on Glucophage.  She returns for reevaluation   UPDATE 1/17/2019CM.  Brenda Hood, 77 year old female returns with CPAP compliance initial compliance check.  She is having a major leak she claims with her mask.  Data 09/27/2017 to 10/26/2017 shows greater than 4 hours usage for 18 days at 60%.  Average usage 5 hours 33 minutes set pressure 5-10 cm.  AHI 2 . Significant leak  Present.  She continues to have some fatigue on awakening.  Does not feel well rested.  She returns for reevaluation   8/30/18CDBrenda Rigdon is a 77 y.o. female , seen here as in a referral/ revisit  from Dr. Valora Piccolo for a follow up on an outside sleep study(  [facility] , Dr. Tonia Ghent  )   Chief complaint according to patient : Brenda Hood presents today on 06/09/2017 in follow-up from Dr. Derenda Mis, who ordered a sleep study for the patient which was performed at the [facility] neurologic group.  Sleep study was performed on 04/04/2017, the patient had endorsed an Epworth sleepiness score of 20 points, and reported inomnia and  fragmented sleep.   She was awake for 204 minutes during the night, there was no REM sleep noticed - sleep efficiency was only 54%, her AHI was 8 / hour which is very low, she would not be considered positive for sleep apnea and would not require CPAP.  Sleep habits are as follows: The patient shares a bedroom with her husband, was a CPAP user. She often dozes off while watching TV or whenever physically not busy during the day. At night however it's difficult for her to go to sleep and she has started to take melatonin 1 mg tablets to 10 are allowed per day she was told. She does still take an hour to go to sleep on average, once asleep she has trouble staying asleep, she is not always sure what wakes her, but once awake she will go to the bathroom. She will have multiple of these wakefulness.  She denies dreams, hallucinations or sleep paralysis, no cataplexy has been reported. Throughout the night, and rises in the morning usually at 8 AM.  Varies greatly- she sleeps on her side , on 1 pillow. She raises the head of her bed ( 20 degrees) The bedroom is cool and quiet and dark. She sometimes turns the Tv on- and will go to sleep.   REVIEW OF SYSTEMS: Full 14 system review of systems performed and notable only for those listed, all others are neg:  Constitutional: Fatigue Cardiovascular: neg Ear/Nose/Throat: neg  Skin:  neg Eyes: Blurred vision, light sensitivity  Respiratory: neg Gastroitestinal: neg  Hematology/Lymphatic: Easy bruising, anemia  Endocrine: neg Musculoskeletal:neg Allergy/Immunology: Environmental allergies Neurological: neg Psychiatric: neg Sleep : Daytime sleepiness obstructive sleep apnea with CPAP   ALLERGIES: Allergies  Allergen Reactions  . Codeine Itching  . Metformin And Related Diarrhea    HOME MEDICATIONS: Outpatient Medications Prior to Visit  Medication Sig Dispense Refill  . acetaminophen (TYLENOL) 325 MG tablet Take 2 tablets (650 mg total) by mouth  every 6 (six) hours as needed for pain. 20 tablet 0  . alendronate (FOSAMAX) 70 MG tablet Take 70 mg by mouth once a week. Take with a full glass of water on an empty stomach.    Marland Kitchen amLODipine (NORVASC) 5 MG tablet Take 5 mg by mouth daily.    Marland Kitchen aspirin 81 MG tablet Take 81 mg by mouth daily.    Marland Kitchen atorvastatin (LIPITOR) 80 MG tablet Take 40 mg by mouth daily.    . brimonidine-timolol (COMBIGAN) 0.2-0.5 % ophthalmic solution Place 1 drop into both eyes every 12 (twelve) hours.    . Calcium Carbonate-Vitamin D (CALCIUM 600/VITAMIN D PO) Take 2 tablets by mouth daily.    . cetirizine (ZYRTEC) 10 MG tablet Take 10 mg by mouth daily.    . cyclobenzaprine (FLEXERIL) 10 MG tablet Take 1 tablet (10 mg total) by mouth 2 (two) times daily as needed for muscle spasms. 20 tablet 0  . fluticasone (FLONASE) 50 MCG/ACT nasal spray Place 2 sprays into both nostrils daily.    Marland Kitchen GINKGO BILOBA EXTRACT PO Take by mouth.    Marland Kitchen lisinopril (PRINIVIL,ZESTRIL) 5 MG tablet     . loperamide (IMODIUM) 2 MG capsule Take by mouth.    . Melatonin ([employer]S MELATONIN) 10 MG CAPS Take 1 capsule by mouth daily.    . meloxicam (MOBIC) 15 MG tablet     . metFORMIN (GLUCOPHAGE-XR) 500 MG 24 hr tablet Take 500 mg by mouth daily. Takes at dinnertime  11  . Misc Natural Products (IN-FLA-MEND) CAPS Take by mouth.    . mometasone (NASONEX) 50 MCG/ACT nasal spray Place 2 sprays into the nose daily.    . montelukast (SINGULAIR) 10 MG tablet Take 10 mg by mouth at bedtime.    Marland Kitchen omeprazole (PRILOSEC) 40 MG capsule   11  . sertraline (ZOLOFT) 100 MG tablet Take 100 mg by mouth daily.     . Travoprost, BAK Free, (TRAVATAN) 0.004 % SOLN ophthalmic solution 1 drop at bedtime.    Marland Kitchen zolpidem (AMBIEN) 5 MG tablet     . ALPRAZolam (XANAX) 0.5 MG tablet Take 1 tablet (0.5 mg total) by mouth at bedtime as needed for anxiety. (Patient not taking: Reported on 01/26/2018) 2 tablet 0  . pantoprazole (PROTONIX) 40 MG tablet Take 40 mg by mouth daily.    Marland Kitchen  UNKNOWN TO PATIENT HTN med     No facility-administered medications prior to visit.     PAST MEDICAL HISTORY: Past Medical History:  Diagnosis Date  . Basal cell carcinoma of left lower eyelid   . Diabetes mellitus without complication ([place])   . GERD (gastroesophageal reflux disease)   . Glaucoma   . Hypercholesteremia   . Hyperlipidemia   . Hypertension   . Major depression   . Plantar fasciitis   . Seasonal allergies     PAST SURGICAL HISTORY: Past Surgical History:  Procedure Laterality Date  . COLONOSCOPY    . TONSILLECTOMY    . TUBAL LIGATION    .  WISDOM TOOTH EXTRACTION      FAMILY HISTORY: Family History  Problem Relation Age of Onset  . Cancer Mother   . Diabetes Mother   . Diabetes Father     SOCIAL HISTORY: Social History   Socioeconomic History  . Marital status: Married    Spouse name: Not on file  . Number of children: Not on file  . Years of education: Not on file  . Highest education level: Not on file  Occupational History  . Not on file  Social Needs  . Financial resource strain: Not on file  . Food insecurity:    Worry: Not on file    Inability: Not on file  . Transportation needs:    Medical: Not on file    Non-medical: Not on file  Tobacco Use  . Smoking status: Never Smoker  . Smokeless tobacco: Never Used  Substance and Sexual Activity  . Alcohol use: No  . Drug use: No  . Sexual activity: Not on file  Lifestyle  . Physical activity:    Days per week: Not on file    Minutes per session: Not on file  . Stress: Not on file  Relationships  . Social connections:    Talks on phone: Not on file    Gets together: Not on file    Attends religious service: Not on file    Active member of club or organization: Not on file    Attends meetings of clubs or organizations: Not on file    Relationship status: Not on file  . Intimate partner violence:    Fear of current or ex partner: Not on file    Emotionally abused: Not on file     Physically abused: Not on file    Forced sexual activity: Not on file  Other Topics Concern  . Not on file  Social History Narrative  . Not on file     PHYSICAL EXAM  Vitals:   01/26/18 1026  BP: (!) 143/68  Pulse: 60  Weight: 132 lb (59.9 kg)  Height: 5\' 1"  (1.549 m)   Body mass index is 24.94 kg/m.  Generalized: Well developed, in no acute distress  Head: normocephalic and atraumatic,. Oropharynx benign  Neck: Supple,  Musculoskeletal: No deformity   Neurological examination   Mentation: Alert oriented to time, place, history taking. Attention span and concentration appropriate. Recent and remote memory intact.  Follows all commands speech and language fluent.   Cranial nerve II-XII: Pupils were equal round reactive to light extraocular movements were full, visual field were full on confrontational test. Facial sensation and strength were normal. hearing was intact to finger rubbing bilaterally. Uvula tongue midline. head turning and shoulder shrug were normal and symmetric.Tongue protrusion into cheek strength was normal. Motor: normal bulk and tone, full strength in the BUE, BLE,  Sensory: normal and symmetric to light touch, Coordination: finger-nose-finger, heel-to-shin bilaterally, no dysmetria Gait and Station: Rising up from seated position without assistance, normal stance,  moderate stride, good arm swing, smooth turning, able to perform tiptoe, and heel walking without difficulty. Tandem gait is unsteady.  No assistive device  DIAGNOSTIC DATA (LABS, IMAGING, TESTING) - I reviewed patient records, labs, notes, testing and imaging myself where available.  ASSESSMENT AND PLAN  77 y.o. year old female  has a past medical history of obstructive sleep apnea here for initial CPAP compliance. CPAP data dated 12/26/2017-01/24/2018 shows compliance greater than 4 hours for 16 days and 53%.  Average usage 5  hours 49 minutes.  Set pressure 5-10 cm.  EPR level 3 AHI 1.9.The  patient is a current patient of Dr. Brett Fairy who is out of the office today . This note is sent to the work in doctor.      PLAN:Overall complaint 53% needs to be greater than 70 % reviewed data with patient and expectations Mask just replaced 3 weeks ago according to patient  follow-up in 2 months for repeat compliance Dennie Bible, [facility], [facility], [facility] [address] [place], [place] [address] [phone]

## 2018-01-26 ENCOUNTER — Ambulatory Visit: Payer: Medicare HMO | Admitting: Nurse Practitioner

## 2018-01-26 ENCOUNTER — Encounter: Payer: Self-pay | Admitting: Nurse Practitioner

## 2018-01-26 VITALS — BP 143/68 | HR 60 | Ht 61.0 in | Wt 132.0 lb

## 2018-01-26 DIAGNOSIS — Z9989 Dependence on other enabling machines and devices: Secondary | ICD-10-CM

## 2018-01-26 DIAGNOSIS — G4733 Obstructive sleep apnea (adult) (pediatric): Secondary | ICD-10-CM | POA: Diagnosis not present

## 2018-01-26 NOTE — Patient Instructions (Signed)
Overall complaint 53% needs to be greater than 70%  Mask just replaced 3 weeks ago according to patient  follow-up in 2 months for repeat compliance

## 2018-02-17 NOTE — Progress Notes (Signed)
Personally  participated in, made any corrections needed, and agree with history, physical, neuro exam,assessment and plan as stated above.    Naileah Karg, MD [facility] 

## 2018-05-08 NOTE — Progress Notes (Signed)
[facility]  PATIENT: Brenda Hood DOB: 1941/08/21   REASON FOR VISIT: Follow-up  sleep apnea with  CPAP compliance HISTORY FROM: Patient    HISTORY OF PRESENT ILLNESS:UPDATE 7/30/2019CM [name], 77 year old female returns for follow-up with newly diagnosed obstructive sleep apnea October of last year.  She is not having any problems with her CPAP.  She finally got a mask that fits.  CPAP data dated 04/08/2018-05/07/2018 shows compliance greater than 4 hours at 97%.  Average usage 8 hours 29 minutes.  Set pressure 5 to 10 cm.  EPR level 3 AHI 2.4 ESS 6.  She returns for reevaluation    UPDATE 4/18/2019CM [name], 77 year old female returns for follow-up with obstructive sleep apnea here for CPAP compliance.  When last seen she had a mask that leaked.  She has gone through several masks since that time and feels like the one she got 3 weeks ago is working the best .  She sleeps on her side.  She occasionally wakes up and feels air leaking.  She was made aware she needs to tighten the mask at that point.  CPAP data dated 12/26/2017-01/24/2018 shows compliance greater than 4 hours for 16 days and 53%.  Average usage 5 hours 49 minutes.  Set pressure 5-10 cm.  EPR level 3 AHI 1.9.  She is recently been diagnosed with diabetes and is now on Glucophage.  She returns for reevaluation   UPDATE 1/17/2019CM.  [name], 77 year old female returns with CPAP compliance initial compliance check.  She is having a major leak she claims with her mask.  Data 09/27/2017 to 10/26/2017 shows greater than 4 hours usage for 18 days at 60%.  Average usage 5 hours 33 minutes set pressure 5-10 cm.  AHI 2 . Significant leak  Present.  She continues to have some fatigue on awakening.  Does not feel well rested.  She returns for reevaluation   [name] is a 77 y.o. female , seen here as in a referral/ revisit  from Dr. Valora Piccolo for a follow up on an outside sleep  study(  [facility] , Dr. Tonia Ghent  )   Chief complaint according to patient : Brenda Hood presents today on 06/09/2017 in follow-up from Dr. Derenda Mis, who ordered a sleep study for the patient which was performed at the [facility] neurolog[facility] group.  Sleep study was performed on 04/04/2017, the patient had endorsed an Epworth sleepiness score of 20 points, and reported inomnia and fragmented sleep.   She was awake for 204 minutes during the night, there was no REM sleep noticed - sleep efficiency was only 54%, her AHI was 8 / hour which is very low, she would not be considered positive for sleep apnea and would not require CPAP.  Sleep habits are as follows: The patient shares a bedroom with her husband, was a CPAP user. She often dozes off while watching TV or whenever physically not busy during the day. At night however it's difficult for her to go to sleep and she has started to take melatonin 1 mg tablets to 10 are allowed per day she was told. She does still take an hour to go to sleep on average, once asleep she has trouble staying asleep, she is not always sure what wakes her, but once awake she will go to the bathroom. She will have multiple of these wakefulness.  She denies dreams, hallucinations or sleep paralysis, no cataplexy has been reported. Throughout the night, and  rises in the morning usually at 8 AM.  Varies greatly- she sleeps on her side , on 1 pillow. She raises the head of her bed ( 20 degrees) The bedroom is cool and quiet and dark. She sometimes turns the Tv on- and will go to sleep.   REVIEW OF SYSTEMS: Full 14 system review of systems performed and notable only for those listed, all others are neg:  Constitutional: Fatigue Cardiovascular: neg Ear/Nose/Throat: neg  Skin: neg Eyes: Blurred vision, light sensitivity  Respiratory: neg Gastroitestinal: neg  Hematology/Lymphatic: Easy bruising, anemia  Endocrine:  neg Musculoskeletal:neg Allergy/Immunology: Environmental allergies Neurological: neg Psychiatric: neg Sleep : Daytime sleepiness obstructive sleep apnea with CPAP   ALLERGIES: Allergies  Allergen Reactions  . Codeine Itching  . Metformin And Related Diarrhea    HOME MEDICATIONS: Outpatient Medications Prior to Visit  Medication Sig Dispense Refill  . acetaminophen (TYLENOL) 325 MG tablet Take 2 tablets (650 mg total) by mouth every 6 (six) hours as needed for pain. 20 tablet 0  . alendronate (FOSAMAX) 70 MG tablet Take 70 mg by mouth once a week. Take with a full glass of water on an empty stomach.    Marland Kitchen amLODipine (NORVASC) 5 MG tablet Take 5 mg by mouth daily.    Marland Kitchen aspirin 81 MG tablet Take 81 mg by mouth daily.    Marland Kitchen atorvastatin (LIPITOR) 80 MG tablet Take 40 mg by mouth daily.    . brimonidine-timolol (COMBIGAN) 0.2-0.5 % ophthalmic solution Place 1 drop into both eyes every 12 (twelve) hours.    . Calcium Carbonate-Vitamin D (CALCIUM 600/VITAMIN D PO) Take 2 tablets by mouth daily.    . cetirizine (ZYRTEC) 10 MG tablet Take 10 mg by mouth daily.    . cyclobenzaprine (FLEXERIL) 10 MG tablet Take 1 tablet (10 mg total) by mouth 2 (two) times daily as needed for muscle spasms. 20 tablet 0  . fluticasone (FLONASE) 50 MCG/ACT nasal spray Place 2 sprays into both nostrils daily.    Marland Kitchen GINKGO BILOBA EXTRACT PO Take by mouth.    Marland Kitchen lisinopril (PRINIVIL,ZESTRIL) 5 MG tablet     . loperamide (IMODIUM) 2 MG capsule Take by mouth.    . Melatonin (CVS MELATONIN) 10 MG CAPS Take 1 capsule by mouth daily.    . meloxicam (MOBIC) 15 MG tablet     . metFORMIN (GLUCOPHAGE-XR) 500 MG 24 hr tablet Take 500 mg by mouth daily. Takes at dinnertime  11  . Misc Natural Products (IN-FLA-MEND) CAPS Take by mouth.    . mometasone (NASONEX) 50 MCG/ACT nasal spray Place 2 sprays into the nose daily.    . montelukast (SINGULAIR) 10 MG tablet Take 10 mg by mouth at bedtime.    . Multiple Vitamin  (MULTIVITAMIN) tablet Take 1 tablet by mouth daily.    Marland Kitchen omeprazole (PRILOSEC) 40 MG capsule   11  . sertraline (ZOLOFT) 100 MG tablet Take 100 mg by mouth daily.     . Travoprost, BAK Free, (TRAVATAN) 0.004 % SOLN ophthalmic solution 1 drop at bedtime.    Marland Kitchen zolpidem (AMBIEN) 5 MG tablet      No facility-administered medications prior to visit.     PAST MEDICAL HISTORY: Past Medical History:  Diagnosis Date  . Basal cell carcinoma of left lower eyelid   . Diabetes mellitus without complication ([place])   . GERD (gastroesophageal reflux disease)   . Glaucoma   . Hypercholesteremia   . Hyperlipidemia   . Hypertension   . Major  depression   . OSA on CPAP   . Plantar fasciitis   . Seasonal allergies     PAST SURGICAL HISTORY: Past Surgical History:  Procedure Laterality Date  . COLONOSCOPY    . TONSILLECTOMY    . TUBAL LIGATION    . WISDOM TOOTH EXTRACTION      FAMILY HISTORY: Family History  Problem Relation Age of Onset  . Cancer Mother   . Diabetes Mother   . Diabetes Father     SOCIAL HISTORY: Social History   Socioeconomic History  . Marital status: Married    Spouse name: Not on file  . Number of children: Not on file  . Years of education: Not on file  . Highest education level: Not on file  Occupational History  . Not on file  Social Needs  . Financial resource strain: Not on file  . Food insecurity:    Worry: Not on file    Inability: Not on file  . Transportation needs:    Medical: Not on file    Non-medical: Not on file  Tobacco Use  . Smoking status: Never Smoker  . Smokeless tobacco: Never Used  Substance and Sexual Activity  . Alcohol use: No  . Drug use: No  . Sexual activity: Not on file  Lifestyle  . Physical activity:    Days per week: Not on file    Minutes per session: Not on file  . Stress: Not on file  Relationships  . Social connections:    Talks on phone: Not on file    Gets together: Not on file    Attends religious  service: Not on file    Active member of club or organization: Not on file    Attends meetings of clubs or organizations: Not on file    Relationship status: Not on file  . Intimate partner violence:    Fear of current or ex partner: Not on file    Emotionally abused: Not on file    Physically abused: Not on file    Forced sexual activity: Not on file  Other Topics Concern  . Not on file  Social History Narrative  . Not on file     PHYSICAL EXAM  Vitals:   05/09/18 1001  BP: 126/60  Pulse: 69  Weight: 125 lb 12.8 oz (57.1 kg)  Height: 5\' 1"  (1.549 m)   Body mass index is 23.77 kg/m.  Generalized: Well developed, in no acute distress  Head: normocephalic and atraumatic,. Oropharynx benign mallopatti2 Neck: Supple, circumference 14.5 Lungs clear Musculoskeletal: No deformity  Skin no peripheral edema Neurological examination   Mentation: Alert oriented to time, place, history taking. Attention span and concentration appropriate. Recent and remote memory intact.  Follows all commands speech and language fluent.   Cranial nerve II-XII: Pupils were equal round reactive to light extraocular movements were full, visual field were full on confrontational test. Facial sensation and strength were normal. hearing was intact to finger rubbing bilaterally. Uvula tongue midline. head turning and shoulder shrug were normal and symmetric.Tongue protrusion into cheek strength was normal. Motor: normal bulk and tone, full strength in the BUE, BLE,  Sensory: normal and symmetric to light touch, Coordination: finger-nose-finger, heel-to-shin bilaterally, no dysmetria Gait and Station: Rising up from seated position without assistance, normal stance,  moderate stride, good arm swing, smooth turning, able to perform tiptoe, and heel walking without difficulty. Tandem gait is mildly unsteady.  No assistive device  DIAGNOSTIC DATA (LABS, IMAGING, TESTING) -  I reviewed patient records, labs,  notes, testing and imaging myself where available.  ASSESSMENT AND PLAN  77 y.o. year old female  has a past medical history of obstructive sleep apnea here for initial CPAP compliance. Data dated 04/08/2018-05/07/2018 shows compliance greater than 4 hours at 97%.  Average usage 8 hours 29 minutes.  Set pressure 5 to 10 cm.  EPR level 3 AHI 2.4 ESS 6.   PLAN: CPAP compliance 97%  Continue same settings  follow-up in 6 months for repeat compliance then yearly  Dennie Bible, [facility], [facility], [facility] [address], [place], [place] [address] [phone]

## 2018-05-09 ENCOUNTER — Ambulatory Visit (INDEPENDENT_AMBULATORY_CARE_PROVIDER_SITE_OTHER): Payer: Medicare HMO | Admitting: Nurse Practitioner

## 2018-05-09 ENCOUNTER — Encounter: Payer: Self-pay | Admitting: Nurse Practitioner

## 2018-05-09 VITALS — BP 126/60 | HR 69 | Ht 61.0 in | Wt 125.8 lb

## 2018-05-09 DIAGNOSIS — G4733 Obstructive sleep apnea (adult) (pediatric): Secondary | ICD-10-CM | POA: Diagnosis not present

## 2018-05-09 DIAGNOSIS — Z9989 Dependence on other enabling machines and devices: Secondary | ICD-10-CM | POA: Diagnosis not present

## 2018-05-09 NOTE — Patient Instructions (Signed)
CPAP compliance 97%  Continue same settings  follow-up in 6 months for repeat compliance then yearly

## 2018-11-13 ENCOUNTER — Encounter: Payer: Self-pay | Admitting: Neurology

## 2018-11-15 ENCOUNTER — Ambulatory Visit: Payer: Medicare Other | Admitting: Neurology

## 2018-11-15 ENCOUNTER — Ambulatory Visit: Payer: Medicare HMO | Admitting: Nurse Practitioner

## 2018-11-15 ENCOUNTER — Encounter: Payer: Self-pay | Admitting: Neurology

## 2018-11-15 VITALS — BP 128/68 | HR 74 | Ht 61.0 in | Wt 129.0 lb

## 2018-11-15 DIAGNOSIS — E1140 Type 2 diabetes mellitus with diabetic neuropathy, unspecified: Secondary | ICD-10-CM | POA: Diagnosis not present

## 2018-11-15 DIAGNOSIS — F5104 Psychophysiologic insomnia: Secondary | ICD-10-CM | POA: Diagnosis not present

## 2018-11-15 DIAGNOSIS — G4733 Obstructive sleep apnea (adult) (pediatric): Secondary | ICD-10-CM | POA: Diagnosis not present

## 2018-11-15 DIAGNOSIS — E1149 Type 2 diabetes mellitus with other diabetic neurological complication: Secondary | ICD-10-CM

## 2018-11-15 DIAGNOSIS — Z9989 Dependence on other enabling machines and devices: Secondary | ICD-10-CM | POA: Diagnosis not present

## 2018-11-15 NOTE — Progress Notes (Signed)
SLEEP MEDICINE CLINIC   Provider:  Larey Seat, [place] D  Primary Care Physician:  Loraine Leriche., MD   Referring Provider: Loraine Leriche.,*    Chief Complaint  Patient presents with  . Follow-up    pt with husband, rm 44, DME [facility]. pt states    RV 11-15-2018,  Brenda Hood is a 78 y.o. female patient , seen here on 11-15-2018 in revisit. She has been followed here since her sleep study form 2018 which is quoted below. AHI of 5.7 higher in REM , 57 minutes of desaturation. She uses CPAP, but has recently noted an increase in nasal allergies again.  She has had allergies all her life, but they seem to have been better controlled for a while.  She now has often sniffling, a runny nose but also a dry nose.  She is using nasal pillows and prevents sore spots by putting some Vaseline on it.  Her compliance is excellent she has used the machine 27 out of 30 days excluding 3 days is upper airway infections.  Her average user time of 7 hours and 20 minutes, she has used the machine 24 out of 30 days over 4 hours.  Minimum pressure is set at 5 maximum at 10 cmH2O with 3 cm EPR, her AHI is 2.7 which is half of what she used to present with during her baseline study, and most of the visit remaining apneas are obstructive.  Her average pressure need is 9.5 cmH2O based on this I would like to increase her maximum pressure to 11 or even 12 cm.         HPI:   Consult :  Dr. Theodis Sato for a follow up on an outside sleep study ( [facility] , Dr. Tonia Ghent  ) Chief complaint according to patient : Brenda Hood presents today on 06/09/2017 in follow-up from Dr. Derenda Mis, who ordered a sleep study for the patient which was performed at the [facility] neurologic [facility].  Sleep study was performed on 04/04/2017, the patient had endorsed an Epworth sleepiness score of 20 points, and reported inomnia and fragmented sleep.   Sleep medical history and family  sleep history:  No insomnia in family. Diabetes in all siblings, both parents.   Social history:  Married, never used tobacco , no ETOH consue, Caffeine ; coffee 2 -3 in AM. Soda and iced tea - now 1 a day.    Review of Systems: Out of a complete 14 system review, the patient complains of only the following symptoms, and all other reviewed systems are negative.   Tendency to bruise easier, she  is on ASA. Restless legs, insomnia she has been diagnosed with diabetes possible neuropathy,  Epworth score pre CPAP was 20/24, and on 11-15-2018 is 9 points on CPAP  , Fatigue severity score from 54 decreased to 34 / 63 points , depression score 2/ 15 from 4 points in 2018.  Nocturia once on CPAP , from 2-3 nocturias before.    Social History   Socioeconomic History  . Marital status: Married    Spouse name: Not on file  . Number of children: Not on file  . Years of education: Not on file  . Highest education level: Not on file  Occupational History  . Not on file  Social Needs  . Financial resource strain: Not on file  . Food insecurity:    Worry: Not on file    Inability: Not on file  .  Transportation needs:    Medical: Not on file    Non-medical: Not on file  Tobacco Use  . Smoking status: Never Smoker  . Smokeless tobacco: Never Used  Substance and Sexual Activity  . Alcohol use: No  . Drug use: No  . Sexual activity: Not on file  Lifestyle  . Physical activity:    Days per week: Not on file    Minutes per session: Not on file  . Stress: Not on file  Relationships  . Social connections:    Talks on phone: Not on file    Gets together: Not on file    Attends religious service: Not on file    Active member of club or organization: Not on file    Attends meetings of clubs or organizations: Not on file    Relationship status: Not on file  . Intimate partner violence:    Fear of current or ex partner: Not on file    Emotionally abused: Not on file    Physically abused: Not  on file    Forced sexual activity: Not on file  Other Topics Concern  . Not on file  Social History Narrative  . Not on file    Family History  Problem Relation Age of Onset  . Cancer Mother   . Diabetes Mother   . Diabetes Father     Past Medical History:  Diagnosis Date  . Basal cell carcinoma of left lower eyelid   . Diabetes mellitus without complication ([place])   . GERD (gastroesophageal reflux disease)   . Glaucoma   . Hypercholesteremia   . Hyperlipidemia   . Hypertension   . Major depression   . OSA on CPAP   . Plantar fasciitis   . Seasonal allergies     Past Surgical History:  Procedure Laterality Date  . COLONOSCOPY    . TONSILLECTOMY    . TUBAL LIGATION    . WISDOM TOOTH EXTRACTION      Current Outpatient Medications  Medication Sig Dispense Refill  . acetaminophen (TYLENOL) 325 MG tablet Take 2 tablets (650 mg total) by mouth every 6 (six) hours as needed for pain. 20 tablet 0  . alendronate (FOSAMAX) 70 MG tablet Take 70 mg by mouth once a week. Take with a full glass of water on an empty stomach.    Marland Kitchen amLODipine (NORVASC) 5 MG tablet Take 5 mg by mouth daily.    Marland Kitchen aspirin 81 MG tablet Take 81 mg by mouth daily.    Marland Kitchen atorvastatin (LIPITOR) 80 MG tablet Take 40 mg by mouth daily.    . brimonidine-timolol (COMBIGAN) 0.2-0.5 % ophthalmic solution Place 1 drop into both eyes every 12 (twelve) hours.    . Calcium Carbonate-Vitamin D (CALCIUM 600/VITAMIN D PO) Take 2 tablets by mouth daily.    . cetirizine (ZYRTEC) 10 MG tablet Take 10 mg by mouth daily.    . fluticasone (FLONASE) 50 MCG/ACT nasal spray Place 2 sprays into both nostrils daily.    Marland Kitchen GINKGO BILOBA EXTRACT PO Take by mouth.    . latanoprost (XALATAN) 0.005 % ophthalmic solution Place 1 drop into both eyes nightly.    Marland Kitchen lisinopril (PRINIVIL,ZESTRIL) 5 MG tablet     . loperamide (IMODIUM) 2 MG capsule Take by mouth.    . Melatonin ([employer]S MELATONIN) 10 MG CAPS Take 1 capsule by mouth daily.    .  meloxicam (MOBIC) 15 MG tablet Take 15 mg by mouth daily as needed.     Marland Kitchen  metFORMIN (GLUCOPHAGE-XR) 500 MG 24 hr tablet Take 250 mg by mouth 2 (two) times daily. Takes at dinnertime  11  . Misc Natural Products (IN-FLA-MEND) CAPS Take by mouth.    . mometasone (NASONEX) 50 MCG/ACT nasal spray Place 2 sprays into the nose daily.    . montelukast (SINGULAIR) 10 MG tablet Take 10 mg by mouth at bedtime.    . Multiple Vitamin (MULTIVITAMIN) tablet Take 1 tablet by mouth daily.    Marland Kitchen omeprazole (PRILOSEC) 40 MG capsule   11  . sertraline (ZOLOFT) 100 MG tablet Take 100 mg by mouth daily.     . Travoprost, BAK Free, (TRAVATAN) 0.004 % SOLN ophthalmic solution 1 drop at bedtime.    Marland Kitchen zolpidem (AMBIEN) 5 MG tablet      No current facility-administered medications for this visit.   DATE OF RECORDING: 06/30/2017 REFERRING M.D.:  Tiana Loft, MD Study Performed:   Baseline Polysomnogram HISTORY:  The patient complains of excessive daytime sleepiness reflected and an Epworth score of 20 / 24 points.  Had been tested in a PSG at Dr. Marya Amsler [facility]'s lab and is here for follow up on outside results. She slept very little in that recording from 04-04-2017, AHI was 8, no REM sleep. She has poor sleep habits and fragmented sleep at home. Insomnia. The patient endorsed the Epworth Sleepiness Scale at 20 points and FSS at 54 points, geriatric depression 4/15 points. Denies narcoleptic dream intrusion, sleep paralysis or cataplectic symptoms.  The patient's weight 125 pounds with a height of 61 (inches), resulting in a BMI of 23.7 kg/m2. The patient's neck circumference measured 14 inches.  CURRENT MEDICATIONS: Tylenol, Fosamax, Norvasc, Aspirin, Lipitor, Calcium, Zyrtec, Flexeril, Flonase, Nasonex, Singulair, Protonix, Travatan eye drops   PROCEDURE:  This is a multichannel digital polysomnogram utilizing the SomnoStar 11.2 system.  Electrodes and sensors were applied and monitored per AASM Specifications.   EEG, EOG,  Chin and Limb EMG, were sampled at 200 Hz.  ECG, Snore and Nasal Pressure, Thermal Airflow, Respiratory Effort, CPAP Flow and Pressure, Oximetry was sampled at 50 Hz. Digital video and audio were recorded.      BASELINE STUDY Lights Out was at 21:03 and Lights On at 05:02.  Total recording time (TRT) was 479.5 minutes, with a total sleep time (TST) of 282 minutes. The patient's sleep latency was 213 minutes.  REM latency was 302 minutes.  The sleep efficiency was again low at 58.8 %.     SLEEP ARCHITECTURE: WASO (Wake after sleep onset) was 162 minutes.  There were 26.5 minutes in Stage N1, 217 minutes Stage N2, 25 minutes Stage N3 and 13.5 minutes in Stage REM.  The percentage of Stage N1 was 9.4%, Stage N2 was 77.%, Stage N3 was 8.9% and Stage R (REM sleep) was 4.8%.   RESPIRATORY ANALYSIS:  There were a total of 27 respiratory events:  2 obstructive apneas, 25 hypopneas with 0 respiratory event related arousals (RERAs).     The total APNEA/HYPOPNEA INDEX (AHI) was 5.7/hour and the total RESPIRATORY DISTURBANCE INDEX was 5.7 /hour.  2 events occurred in REM sleep and 46 events in NREM. The REM AHI was 8.9 /hour, versus a non-REM AHI of 5.6. The patient spent 108 minutes of total sleep time in the supine position and 174 minutes in non-supine. The supine AHI was 8.3 versus a non-supine AHI of 4.1.  OXYGEN SATURATION & C02:  The Wake baseline 02 saturation was 96%, with the lowest being 79%. Time spent below  89% saturation equaled 52 minutes.   PERIODIC LIMB MOVEMENTS:  The patient had a total of 134 Periodic Limb Movements.  The Periodic Limb Movement (PLM) index was 28.5 and the PLM Arousal index was 10.0 /hour. The arousals were noted as: 41 were spontaneous, 47 were associated with PLMs, and 0 were associated with respiratory events.  Audio and video analysis did not show any abnormal or unusual movements, behaviors, phonations or vocalizations.  Snoring was noted. The patient took one bathroom  break. EKG was in keeping with normal sinus rhythm and PVCs (NSR).  She took a sleep aid at midnight.  IMPRESSION:  1.       Mild Obstructive Sleep Apnea (OSA) but significant hypoxemia. 2. Severe Periodic Limb Movement Disorder (PLMD) 3. Primary Snoring 4. Many spontaneous arousals and delayed sleep onset due to (reported) pain and discomfort, cause of insomnia.   RECOMMENDATIONS:  1. Advise full-night, attended, CPAP titration study to optimize therapy.   2. Avoid caffeine-containing beverages and chocolate. 3. Consider cardiopulmonary evaluation for the hypoxia if not previously performed.   4. Correlate clinically for a history consistent with regarding restless legs syndrome (RLS).    Consider secondary restless legs syndrome.  Pharmacotherapy may be warranted.  Obtain a serum ferritin level if the clinical history is consistent with RLS.  Consider iron therapy and evaluation for iron deficiency anemia if the serum ferritin level < 50 ng/mL.  Certain medications or substances may aggravate RLS and common offenders may include the following:  nicotine, caffeine, SSRIs, TCAs, phenothiazine, dopamine antagonists, diphenhydramine, and alcohol.   5. Consider dedicated sleep psychology referral if insomnia is of clinical concern ( cognitive behavior therapy)  6. A follow up appointment will be scheduled in the Sleep Clinic at [facility]. The referring provider will be notified of the results.    Larey Seat, MD      07-03-2017  Diplomat, American Board of Psychiatry and Neurology  Diplomat, [place] of Sleep Medicine Medical Director, [place] [facility]   Allergies as of 11/15/2018 - Review Complete 05/09/2018  Allergen Reaction Noted  . Codeine Itching 01/27/2013  . Metformin and related Diarrhea 01/27/2013    Vitals: BP 128/68   Pulse 74   Ht 5\' 1"  (1.549 m)   Wt 129 lb (58.5 kg)   BMI 24.37 kg/m  Last Weight:  Wt Readings from Last 1  Encounters:  11/15/18 129 lb (58.5 kg)   QPR:FFMB mass index is 24.37 kg/m.     Last Height:   Ht Readings from Last 1 Encounters:  11/15/18 5\' 1"  (1.549 m)    Physical exam:  General: The patient is awake, alert and appears not in acute distress. Head: Normocephalic, atraumatic.\ Mallampati 2,  neck circumference:14.25.  Nasal airflow congestion- rhinitis , Retrognathia is seen.  Cardiovascular:  Regular rate and rhythm , without  murmurs or carotid bruit, and without distended neck veins. Respiratory: Skin:  Without evidence of edema, or rash Trunk: BMI is 24.37 kg/m2 . Neurologic exam : The patient is awake and alert, oriented to place and time.  Logorrhoeic. Inattentive, tangential.  Attention span limited  Speech is fluent with dysphonia .  Cranial nerves: Pupils are equal and briskly reactive to light. Facial motor strength is symmetric and tongue and uvula move midline.  Motor exam: Normal tone, muscle bulk and symmetric strength in all extremities. Sensory:  Fine touch, pinprick and vibration were reduced, especially  fine touch and vibration in both feet - often pin  and needle Coordination: normal without evidence of ataxia, dysmetria or tremor. Gait and station: Patient walks without assistive device .  Assessment:  After physical and neurologic examination, review of laboratory studies,  Personal review of imaging studies, reports of other /same  Imaging studies, results of polysomnography and / or neurophysiology testing and pre-existing records as far as provided in visit., my assessment is   1)  OSA on CPAP, 80% compliance with a 95% pressure straddling the upper settings of her autotitration machine.  I will increase to 12 cm from 10 cm water.  2)  I recommended a so clean machine , as both, her spouse and the patient can use it to clean their devices. She has frequent allergies, rhinitis, and bronchitis.   3) EDS-resolved.   The patient was advised of the nature of  the diagnosed disorder , the treatment options and the  risks for general health and wellness arising from not treating the condition.   I spent more than 25 minutes of face to face time with the patient.  Greater than 50% of time was spent in counseling and coordination of care. We have discussed the diagnosis and differential and I answered the patient's questions.    Plan:  I increase the pressure on her autotitration machine.   Larey Seat, MD 12/18/5318, 2:33 PM  Certified in Neurology by ABPN Certified in [place] by [facility] [address], [place], [place] [address]

## 2019-06-11 ENCOUNTER — Encounter: Payer: Self-pay | Admitting: Adult Health

## 2019-06-11 ENCOUNTER — Other Ambulatory Visit: Payer: Self-pay

## 2019-06-11 ENCOUNTER — Ambulatory Visit (INDEPENDENT_AMBULATORY_CARE_PROVIDER_SITE_OTHER): Payer: Medicare Other | Admitting: Adult Health

## 2019-06-11 VITALS — BP 138/78 | HR 64 | Temp 98.4°F | Ht 61.0 in | Wt 128.2 lb

## 2019-06-11 DIAGNOSIS — Z9989 Dependence on other enabling machines and devices: Secondary | ICD-10-CM | POA: Diagnosis not present

## 2019-06-11 DIAGNOSIS — G4733 Obstructive sleep apnea (adult) (pediatric): Secondary | ICD-10-CM

## 2019-06-11 NOTE — Patient Instructions (Addendum)
Continue using CPAP nightly and greater than 4 hours each night I will increase pressure 5-12cmH20 If your symptoms worsen or you develop new symptoms please let us know.

## 2019-06-11 NOTE — Progress Notes (Signed)
PATIENT: Brenda Hood DOB: January 10, 1941  REASON FOR VISIT: follow up HISTORY FROM: patient  HISTORY OF PRESENT ILLNESS: Today 06/11/19:  [name] is a 78 year old female with a history of obstructive sleep apnea on CPAP.  Her download indicates that she use her machine nightly for compliance of 100%.  She use her machine greater than 4 hours 28 days for compliance of 93%.  On average she uses her machine 8 hours and 22 minutes.  Her residual AHI is 3.1 on 5 to 10 cm of water with EPR of 3.  Her leak in the 95th percentile is 19.3 L/min.  Her pressure in the 95th percentile is 9.7 cm of water.  She reports that the CPAP is working well for her.  Sometimes she feels that her mouth is dry.  Her husband has been adjusting the humidity for her.  She does use Biotene mouthwash.  She returns today for evaluation.  History (Copied from Dr.Dohmeier's note) 11-15-2018,  Brenda Hood is a 78 y.o. female patient , seen here on 11-15-2018 in revisit. She has been followed here since her sleep study form 2018 which is quoted below. AHI of 5.7 higher in REM , 57 minutes of desaturation. She uses CPAP, but has recently noted an increase in nasal allergies again.  She has had allergies all her life, but they seem to have been better controlled for a while.  She now has often sniffling, a runny nose but also a dry nose.  She is using nasal pillows and prevents sore spots by putting some Vaseline on it.  Her compliance is excellent she has used the machine 27 out of 30 days excluding 3 days is upper airway infections.  Her average user time of 7 hours and 20 minutes, she has used the machine 24 out of 30 days over 4 hours.  Minimum pressure is set at 5 maximum at 10 cmH2O with 3 cm EPR, her AHI is 2.7 which is half of what she used to present with during her baseline study, and most of the visit remaining apneas are obstructive.  Her average pressure need is 9.5 cmH2O based on this I would like to  increase her maximum pressure to 11 or even 12 cm.   REVIEW OF SYSTEMS: Out of a complete 14 system review of symptoms, the patient complains only of the following symptoms, and all other reviewed systems are negative.    ALLERGIES: Allergies  Allergen Reactions   Codeine Itching   Metformin And Related Diarrhea    HOME MEDICATIONS: Outpatient Medications Prior to Visit  Medication Sig Dispense Refill   acetaminophen (TYLENOL) 325 MG tablet Take 2 tablets (650 mg total) by mouth every 6 (six) hours as needed for pain. (Patient taking differently: Take 650 mg by mouth every 6 (six) hours as needed for pain. OTC) 20 tablet 0   alendronate (FOSAMAX) 70 MG tablet Take 70 mg by mouth once a week. Take with a full glass of water on an empty stomach.     amLODipine (NORVASC) 5 MG tablet Take 5 mg by mouth daily.     aspirin 81 MG tablet Take 81 mg by mouth daily.     atorvastatin (LIPITOR) 80 MG tablet Take 40 mg by mouth daily.     brimonidine-timolol (COMBIGAN) 0.2-0.5 % ophthalmic solution Place 1 drop into both eyes every 12 (twelve) hours.     Calcium Carbonate-Vitamin D (CALCIUM 600/VITAMIN D PO) Take 2 tablets by mouth daily.  cetirizine (ZYRTEC) 10 MG tablet Take 10 mg by mouth daily.     fluticasone (FLONASE) 50 MCG/ACT nasal spray Place 2 sprays into both nostrils daily.     glipiZIDE (GLUCOTROL XL) 2.5 MG 24 hr tablet Take by mouth.     ketoconazole (NIZORAL) 2 % shampoo Apply topically.     latanoprost (XALATAN) 0.005 % ophthalmic solution Place 1 drop into both eyes nightly.     lisinopril (PRINIVIL,ZESTRIL) 5 MG tablet      loperamide (IMODIUM) 2 MG capsule Take by mouth.     Melatonin (CVS MELATONIN) 10 MG CAPS Take 1 capsule by mouth daily.     meloxicam (MOBIC) 15 MG tablet Take 15 mg by mouth daily as needed.      Misc Natural Products (IN-FLA-MEND) CAPS Take by mouth.     montelukast (SINGULAIR) 10 MG tablet Take 10 mg by mouth at bedtime.      Multiple Vitamin (MULTIVITAMIN) tablet Take 1 tablet by mouth daily.     omeprazole (PRILOSEC) 40 MG capsule   11   OVER THE COUNTER MEDICATION 500 mg daily. Berlerine 500 mg daily     OVER THE COUNTER MEDICATION 1 tablet daily. cognium for memory     sertraline (ZOLOFT) 100 MG tablet Take 100 mg by mouth daily.      Travoprost, BAK Free, (TRAVATAN) 0.004 % SOLN ophthalmic solution 1 drop at bedtime.     zolpidem (AMBIEN) 5 MG tablet PRN     GINKGO BILOBA EXTRACT PO Take by mouth.     metFORMIN (GLUCOPHAGE-XR) 500 MG 24 hr tablet Take 250 mg by mouth 2 (two) times daily. Takes at dinnertime  11   mometasone (NASONEX) 50 MCG/ACT nasal spray Place 2 sprays into the nose daily.     No facility-administered medications prior to visit.     PAST MEDICAL HISTORY: Past Medical History:  Diagnosis Date   Basal cell carcinoma of left lower eyelid    Diabetes mellitus without complication ([facility])    GERD (gastroesophageal reflux disease)    Glaucoma    Hypercholesteremia    Hyperlipidemia    Hypertension    Major depression    OSA on CPAP    Plantar fasciitis    Seasonal allergies     PAST SURGICAL HISTORY: Past Surgical History:  Procedure Laterality Date   COLONOSCOPY     TONSILLECTOMY     TUBAL LIGATION     WISDOM TOOTH EXTRACTION      FAMILY HISTORY: Family History  Problem Relation Age of Onset   Cancer Mother    Diabetes Mother    Diabetes Father     SOCIAL HISTORY: Social History   Socioeconomic History   Marital status: Married    Spouse name: Not on file   Number of children: Not on file   Years of education: Not on file   Highest education level: Not on file  Occupational History   Not on file  Social Needs   Financial resource strain: Not on file   Food insecurity    Worry: Not on file    Inability: Not on file   Transportation needs    Medical: Not on file    Non-medical: Not on file  Tobacco Use   Smoking status:  Never Smoker   Smokeless tobacco: Never Used  Substance and Sexual Activity   Alcohol use: No   Drug use: No   Sexual activity: Not on file  Lifestyle   Physical activity  Days per week: Not on file    Minutes per session: Not on file   Stress: Not on file  Relationships   Social connections    Talks on phone: Not on file    Gets together: Not on file    Attends religious service: Not on file    Active member of club or organization: Not on file    Attends meetings of clubs or organizations: Not on file    Relationship status: Not on file   Intimate partner violence    Fear of current or ex partner: Not on file    Emotionally abused: Not on file    Physically abused: Not on file    Forced sexual activity: Not on file  Other Topics Concern   Not on file  Social History Narrative   Not on file      PHYSICAL EXAM  Vitals:   06/11/19 1402  BP: 138/78  Pulse: 64  Temp: 98.4 F (36.9 C)  Weight: 128 lb 3.2 oz (58.2 kg)  Height: 5\' 1"  (1.549 m)   Body mass index is 24.22 kg/m.  Generalized: Well developed, in no acute distress  Chest: Lungs clear to auscultation bilaterally  Neurological examination  Mentation: Alert oriented to time, place, history taking. Follows all commands speech and language fluent Cranial nerve II-XII: Extraocular movements were full, visual field were full on confrontational test. Head turning and shoulder shrug  were normal and symmetric. Motor: The motor testing reveals 5 over 5 strength of all 4 extremities. Good symmetric motor tone is noted throughout.  Sensory: Sensory testing is intact to soft touch on all 4 extremities. No evidence of extinction is noted.  Coordination: Cerebellar testing reveals good finger-nose-finger and heel-to-shin bilaterally.  Gait and station: Gait is normal. Reflexes: Deep tendon reflexes are symmetric and normal bilaterally.   DIAGNOSTIC DATA (LABS, IMAGING, TESTING) - I reviewed patient  records, labs, notes, testing and imaging myself where available.     ASSESSMENT AND PLAN 78 y.o. year old female  has a past medical history of Basal cell carcinoma of left lower eyelid, Diabetes mellitus without complication ([place]), GERD (gastroesophageal reflux disease), Glaucoma, Hypercholesteremia, Hyperlipidemia, Hypertension, Major depression, OSA on CPAP, Plantar fasciitis, and Seasonal allergies. here with:  1.  Obstructive sleep apnea on CPAP  Patient CPAP download shows excellent compliance and good treatment of her apnea.  She is encouraged to continue using CPAP nightly and greater than 4 hours each night.  I will increase her pressure to 5 to 12 cm of water to see if this further reduces her AHI.  She is advised that if her symptoms worsen or she develops new symptoms she should let us know.  She will follow-up in 1 year or sooner if needed   I spent 15 minutes with the patient. 50% of this time was spent reviewing CPAP download   Ward Givens, MSN, NP-C 06/11/2019, 2:07 PM [facility] [address], [place], [place] [address] [phone]

## 2019-06-12 NOTE — Progress Notes (Signed)
cpap orders received by [facility]. sy 06-12-19.

## 2020-06-04 ENCOUNTER — Ambulatory Visit: Payer: Medicare Other | Admitting: Adult Health

## 2020-10-22 ENCOUNTER — Other Ambulatory Visit: Payer: Self-pay

## 2020-10-22 ENCOUNTER — Other Ambulatory Visit: Payer: Medicare Other

## 2020-10-22 DIAGNOSIS — Z20822 Contact with and (suspected) exposure to covid-19: Secondary | ICD-10-CM

## 2020-10-24 LAB — NOVEL CORONAVIRUS, NAA: SARS-CoV-2, NAA: DETECTED — AB

## 2020-10-24 LAB — SARS-COV-2, NAA 2 DAY TAT

## 2023-08-11 ENCOUNTER — Encounter: Payer: Self-pay | Admitting: Adult Health
